# Patient Record
Sex: Male | Born: 2005 | Race: White | Hispanic: No | Marital: Single | Smoking: Never smoker
Health system: Southern US, Community
[De-identification: ages and names within clinical notes are randomized; demographics above are authoritative.]

## PROBLEM LIST (undated history)

## (undated) HISTORY — PX: INGUINAL HERNIA REPAIR: SUR1180

---
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
## 2024-02-23 ENCOUNTER — Other Ambulatory Visit: Payer: Self-pay

## 2024-02-23 ENCOUNTER — Encounter (HOSPITAL_COMMUNITY): Payer: Self-pay | Admitting: *Deleted

## 2024-02-23 ENCOUNTER — Emergency Department (HOSPITAL_COMMUNITY)
Admission: EM | Admit: 2024-02-23 | Discharge: 2024-02-23 | Disposition: A | Discharge status: Home / Self Care | Attending: Emergency Medicine | Admitting: Emergency Medicine

## 2024-02-23 DIAGNOSIS — R041 Hemorrhage from throat: Secondary | ICD-10-CM | POA: Insufficient documentation

## 2024-02-23 DIAGNOSIS — J392 Other diseases of pharynx: Secondary | ICD-10-CM

## 2024-02-23 LAB — COMPREHENSIVE METABOLIC PANEL WITH GFR
ALT: 73 U/L — ABNORMAL HIGH (ref 0–44)
AST: 40 U/L (ref 15–41)
Albumin: 3.9 g/dL (ref 3.5–5.0)
Alkaline Phosphatase: 104 U/L (ref 38–126)
Anion gap: 13 (ref 5–15)
BUN: 15 mg/dL (ref 6–20)
CO2: 25 mmol/L (ref 22–32)
Calcium: 9.6 mg/dL (ref 8.9–10.3)
Chloride: 98 mmol/L (ref 98–111)
Creatinine, Ser: 1.03 mg/dL (ref 0.61–1.24)
GFR, Estimated: >60 mL/min (ref >60)
Glucose, Bld: 98 mg/dL (ref 70–99)
Potassium: 3.6 mmol/L (ref 3.5–5.1)
Sodium: 136 mmol/L (ref 135–145)
Total Bilirubin: 1 mg/dL (ref 0.0–1.2)
Total Protein: 7.5 g/dL (ref 6.5–8.1)

## 2024-02-23 LAB — CBC
HCT: 42.3 % (ref 39.0–52.0)
Hemoglobin: 14.9 g/dL (ref 13.0–17.0)
MCH: 31 pg (ref 26.0–34.0)
MCHC: 35.2 g/dL (ref 30.0–36.0)
MCV: 87.9 fL (ref 80.0–100.0)
Platelets: 404 K/uL — ABNORMAL HIGH (ref 150–400)
RBC: 4.81 MIL/uL (ref 4.22–5.81)
RDW: 11 % — ABNORMAL LOW (ref 11.5–15.5)
WBC: 9.8 K/uL (ref 4.0–10.5)
nRBC: 0 % (ref 0.0–0.2)

## 2024-02-23 LAB — SAMPLE TO BLOOD BANK

## 2024-02-23 MED ORDER — ONDANSETRON HCL 4 MG/2ML IJ SOLN
4.0000 mg | Freq: Once | INTRAMUSCULAR | Status: AC
  Administered 2024-02-23: 4 mg via INTRAVENOUS
  Filled 2024-02-23: qty 2

## 2024-02-23 MED ORDER — TRANEXAMIC ACID FOR INHALATION
500.0000 mg | Freq: Once | RESPIRATORY_TRACT | Status: AC
  Administered 2024-02-23: 500 mg via RESPIRATORY_TRACT
  Filled 2024-02-23: qty 10

## 2024-02-23 NOTE — ED Provider Notes (Signed)
 [facility] AT [facility] Provider Note   CSN: [id number] Arrival date & time: 02/23/24  9766     History Chief Complaint  Patient presents with   Bleeding Post-Tonsillectomy    HPI Seth Bennett is a 18 y.o. male presenting for BRB per mouth. Tonsils removed recently subtantial oral bleeding tonight Large clot Seth Bennett with ENT on July 11th  Patient's recorded medical, surgical, social, medication list and allergies were reviewed in the Snapshot window as part of the initial history.   Review of Systems   Review of Systems  Constitutional:  Negative for chills and fever.  HENT:  Negative for ear pain and sore throat.   Eyes:  Negative for pain and visual disturbance.  Respiratory:  Negative for cough and shortness of breath.   Cardiovascular:  Negative for chest pain and palpitations.  Gastrointestinal:  Negative for abdominal pain and vomiting.  Genitourinary:  Negative for dysuria and hematuria.  Musculoskeletal:  Negative for arthralgias and back pain.  Skin:  Negative for color change and rash.  Neurological:  Negative for seizures and syncope.  All other systems reviewed and are negative.   Physical Exam Updated Vital Signs BP 133/86 (BP Location: Right Arm)   Pulse 94   Temp 98.7 F (37.1 C)   Resp 18   Ht 5' 10 (1.778 m)   Wt 71.7 kg   SpO2 99%   BMI 22.67 kg/m  Physical Exam Vitals and nursing note reviewed.  Constitutional:      General: He is not in acute distress.    Appearance: He is well-developed.  HENT:     Head: Normocephalic and atraumatic.     Comments: Substantial posterior oropharyngeal bleeding with active hemorrhage appreciated at this time. Eyes:     Conjunctiva/sclera: Conjunctivae normal.  Cardiovascular:     Rate and Rhythm: Normal rate and regular rhythm.     Heart sounds: No murmur heard. Pulmonary:     Effort: Pulmonary effort is normal. No respiratory distress.     Breath sounds: Normal breath  sounds.  Abdominal:     Palpations: Abdomen is soft.     Tenderness: There is no abdominal tenderness.  Musculoskeletal:        General: No swelling.     Cervical back: Neck supple.  Skin:    General: Skin is warm and dry.     Capillary Refill: Capillary refill takes less than 2 seconds.  Neurological:     Mental Status: He is alert.  Psychiatric:        Mood and Affect: Mood normal.      ED Course/ Medical Decision Making/ A&P    Procedures Procedures   Medications Ordered in ED Medications  tranexamic acid  (CYKLOKAPRON ) 1000 MG/10ML nebulizer solution 500 mg (500 mg Nebulization Given 02/23/24 0256)  ondansetron  (ZOFRAN ) injection 4 mg (4 mg Intravenous Given 02/23/24 0311)    Medical Decision Making:   18 year old male with tonsillar bleeding after tonsillectomy. Initially he had some hemorrhage.  Tranexamic acid  inhaled was initiated.  Patient observed for 2 hours.  He had complete resolution of the hemorrhage.  Able to tolerate fluid p.o. after 2 hours of observation.  Lab work shows no acute anemia or coagulopathy. Observed for an additional 30 minutes after last p.o. with successful resolution of the syndrome.  Discussed pain control, importance following up with primary ENT.  Strict return precautions regarding recurrence reinforced and patient expressed understanding.   Clinical Impression:  1. Oropharyngeal bleeding  Discharge   Final Clinical Impression(s) / ED Diagnoses Final diagnoses:  Oropharyngeal bleeding    Rx / DC Orders ED Discharge Orders     None         Jerral Meth, MD 02/23/24 0500

## 2024-02-23 NOTE — ED Notes (Signed)
 Pt passed PO challenge. Took 4 sips of water. Reported pain with swallowing, but stated that it was painful before the bleeding started as well.

## 2024-02-23 NOTE — ED Triage Notes (Signed)
 The pt had hi tonsils removed  July 11th by an ent out of town  he awakened this am bleeding large clots in the back of his throat
